# Patient Record
Sex: Female | Born: 2000 | Race: White | Hispanic: No | Marital: Single | Smoking: Never smoker
Health system: Southern US, Community
[De-identification: ages and names within clinical notes are randomized; demographics above are authoritative.]

## PROBLEM LIST (undated history)

## (undated) DIAGNOSIS — L600 Ingrowing nail: Secondary | ICD-10-CM

## (undated) HISTORY — PX: TONSILLECTOMY: SUR1361

---
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
## 2021-12-26 ENCOUNTER — Ambulatory Visit (INDEPENDENT_AMBULATORY_CARE_PROVIDER_SITE_OTHER): Payer: Medicaid Other | Admitting: Podiatry

## 2021-12-26 ENCOUNTER — Encounter: Payer: Self-pay | Admitting: Podiatry

## 2021-12-26 DIAGNOSIS — L600 Ingrowing nail: Secondary | ICD-10-CM

## 2021-12-26 NOTE — Progress Notes (Signed)
  Subjective:  Patient ID: Renee Grant, female    DOB: 06-Aug-2000,   MRN: [id number]  No chief complaint on file.   21 y.o. female presents for concern of right great ingrown toenail. Does have a history of issues with ingrown nails. Relates this one has been present for about a month. Relate it is sore and has not tried any treatment  . Denies any other pedal complaints. Denies n/v/f/c.   Past Medical History:  Diagnosis Date   Ingrown nail     Objective:  Physical Exam: Vascular: DP/PT pulses 2/4 bilateral. CFT <3 seconds. Normal hair growth on digits. No edema.  Skin. No lacerations or abrasions bilateral feet. Right hallux medial border incurvated. No erythema edema or purulence noted. Tender to palpation.  Musculoskeletal: MMT 5/5 bilateral lower extremities in DF, PF, Inversion and Eversion. Deceased ROM in DF of ankle joint.  Neurological: Sensation intact to light touch.   Assessment:   1. Ingrown nail of great toe of right foot      Plan:  Patient was evaluated and treated and all questions answered. Patient requesting removal of ingrown nail today. Procedure below.  Discussed procedure and post procedure care and patient expressed understanding.  Will follow-up in 2 weeks for nail check or sooner if any problems arise.    Procedure:  Procedure: partial Nail Avulsion of right hallux medial nail border.  Surgeon: Louann Sjogren, DPM  Pre-op Dx: Ingrown toenail without infection Post-op: Same  Place of Surgery: Office exam room.  Indications for surgery: Painful and ingrown toenail.    The patient is requesting removal of nail with chemical matrixectomy. Risks and complications were discussed with the patient for which they understand and written consent was obtained. Under sterile conditions a total of 3 mL of  1% lidocaine plain was infiltrated in a hallux block fashion. Once anesthetized, the skin was prepped in sterile fashion. A tourniquet was then applied. Next  the medial aspect of hallux nail border was then sharply excised making sure to remove the entire offending nail border.  Next phenol was then applied under standard conditions and copiously irrigated. Silvadene was applied. A dry sterile dressing was applied. After application of the dressing the tourniquet was removed and there is found to be an immediate capillary refill time to the digit. The patient tolerated the procedure well without any complications. Post procedure instructions were discussed the patient for which he verbally understood. Follow-up in two weeks for nail check or sooner if any problems are to arise. Discussed signs/symptoms of infection and directed to call the office immediately should any occur or go directly to the emergency room. In the meantime, encouraged to call the office with any questions, concerns, changes symptoms.   Louann Sjogren, DPM

## 2021-12-26 NOTE — Patient Instructions (Signed)

## 2022-01-15 ENCOUNTER — Ambulatory Visit: Payer: Medicaid Other | Admitting: Podiatry
